# Patient Record
Sex: Male | Born: 1985 | Race: White | Hispanic: No | Smoking: Never smoker
Health system: Southern US, Community
[De-identification: ages and names within clinical notes are randomized; demographics above are authoritative.]

## PROBLEM LIST (undated history)

## (undated) HISTORY — PX: APPENDECTOMY: SHX54

---

## 2018-10-22 ENCOUNTER — Encounter (HOSPITAL_COMMUNITY): Payer: Self-pay | Admitting: Emergency Medicine

## 2018-10-22 ENCOUNTER — Emergency Department (HOSPITAL_COMMUNITY): Payer: No Typology Code available for payment source

## 2018-10-22 ENCOUNTER — Emergency Department (HOSPITAL_COMMUNITY)
Admission: EM | Admit: 2018-10-22 | Discharge: 2018-10-23 | Disposition: A | Discharge status: Home / Self Care | Payer: No Typology Code available for payment source | Attending: Emergency Medicine | Admitting: Emergency Medicine

## 2018-10-22 DIAGNOSIS — Y999 Unspecified external cause status: Secondary | ICD-10-CM | POA: Diagnosis not present

## 2018-10-22 DIAGNOSIS — Y9389 Activity, other specified: Secondary | ICD-10-CM | POA: Insufficient documentation

## 2018-10-22 DIAGNOSIS — S022XXA Fracture of nasal bones, initial encounter for closed fracture: Secondary | ICD-10-CM | POA: Insufficient documentation

## 2018-10-22 DIAGNOSIS — R079 Chest pain, unspecified: Secondary | ICD-10-CM | POA: Diagnosis not present

## 2018-10-22 DIAGNOSIS — R040 Epistaxis: Secondary | ICD-10-CM | POA: Diagnosis not present

## 2018-10-22 DIAGNOSIS — Y929 Unspecified place or not applicable: Secondary | ICD-10-CM | POA: Diagnosis not present

## 2018-10-22 LAB — PROTIME-INR
INR: 1.05
Prothrombin Time: 13.6 seconds (ref 11.4–15.2)

## 2018-10-22 MED ORDER — ACETAMINOPHEN 10 MG/ML IV SOLN
1000.0000 mg | Freq: Four times a day (QID) | INTRAVENOUS | Status: DC
  Administered 2018-10-23: 1000 mg via INTRAVENOUS
  Filled 2018-10-22 (×4): qty 100

## 2018-10-22 MED ORDER — SODIUM CHLORIDE 0.9 % IV BOLUS
1000.0000 mL | Freq: Once | INTRAVENOUS | Status: AC
  Administered 2018-10-22: 1000 mL via INTRAVENOUS

## 2018-10-22 MED ORDER — ONDANSETRON HCL 4 MG/2ML IJ SOLN
4.0000 mg | Freq: Once | INTRAMUSCULAR | Status: AC
  Administered 2018-10-22: 4 mg via INTRAVENOUS
  Filled 2018-10-22: qty 2

## 2018-10-22 MED ORDER — CLINDAMYCIN PHOSPHATE 600 MG/50ML IV SOLN
600.0000 mg | Freq: Once | INTRAVENOUS | Status: AC
  Administered 2018-10-22: 600 mg via INTRAVENOUS
  Filled 2018-10-22: qty 50

## 2018-10-22 MED ORDER — SODIUM CHLORIDE 0.9 % IV BOLUS
1000.0000 mL | Freq: Once | INTRAVENOUS | Status: AC
  Administered 2018-10-23: 1000 mL via INTRAVENOUS

## 2018-10-22 MED ORDER — TETANUS-DIPHTH-ACELL PERTUSSIS 5-2.5-18.5 LF-MCG/0.5 IM SUSP
0.5000 mL | Freq: Once | INTRAMUSCULAR | Status: AC
  Administered 2018-10-22: 0.5 mL via INTRAMUSCULAR
  Filled 2018-10-22: qty 0.5

## 2018-10-22 MED ORDER — SODIUM CHLORIDE 0.9 % IV SOLN: INTRAVENOUS | Status: DC

## 2018-10-22 NOTE — ED Notes (Signed)
Pt had clothes already cut off upon arrival to our ED. Pt had $500 (5- [employer] Hundred Dollar Bills) in pocket that were all partially cut. Pt also had cash in his wallet. All cash counted and put together in wallet. All together there is $1,035 in cash in Pts wallet. Pt and tech- Gus witnessed count. Wallet put in Pt belonging bag with socks.
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
## 2018-10-23 ENCOUNTER — Emergency Department (HOSPITAL_COMMUNITY): Payer: No Typology Code available for payment source

## 2018-10-23 ENCOUNTER — Encounter (HOSPITAL_COMMUNITY): Payer: Self-pay | Admitting: Emergency Medicine

## 2018-10-23 LAB — CBC
HCT: 45.5 % (ref 39.0–52.0)
Hemoglobin: 16.1 g/dL (ref 13.0–17.0)
MCH: 32.9 pg (ref 26.0–34.0)
MCHC: 35.4 g/dL (ref 30.0–36.0)
MCV: 93 fL (ref 80.0–100.0)
NRBC: 0 % (ref 0.0–0.2)
Platelets: 184 10*3/uL (ref 150–400)
RBC: 4.89 MIL/uL (ref 4.22–5.81)
RDW: 11.8 % (ref 11.5–15.5)
WBC: 8.5 10*3/uL (ref 4.0–10.5)

## 2018-10-23 LAB — COMPREHENSIVE METABOLIC PANEL
ALT: 31 U/L (ref 0–44)
AST: 33 U/L (ref 15–41)
Albumin: 4 g/dL (ref 3.5–5.0)
Alkaline Phosphatase: 40 U/L (ref 38–126)
Anion gap: 12 (ref 5–15)
BUN: 16 mg/dL (ref 6–20)
CHLORIDE: 101 mmol/L (ref 98–111)
CO2: 23 mmol/L (ref 22–32)
Calcium: 9.1 mg/dL (ref 8.9–10.3)
Creatinine, Ser: 0.94 mg/dL (ref 0.61–1.24)
GFR calc non Af Amer: >60 mL/min (ref >60)
Glucose, Bld: 98 mg/dL (ref 70–99)
Potassium: 3.9 mmol/L (ref 3.5–5.1)
Sodium: 136 mmol/L (ref 135–145)
Total Bilirubin: 0.9 mg/dL (ref 0.3–1.2)
Total Protein: 6.7 g/dL (ref 6.5–8.1)

## 2018-10-23 LAB — SAMPLE TO BLOOD BANK

## 2018-10-23 LAB — ETHANOL: Alcohol, Ethyl (B): <10 mg/dL (ref <10)

## 2018-10-23 LAB — LACTIC ACID, PLASMA: Lactic Acid, Venous: 0.7 mmol/L (ref 0.5–1.9)

## 2018-10-23 MED ORDER — METHOCARBAMOL 1000 MG/10ML IJ SOLN: 1000.0000 mg | Freq: Once | INTRAMUSCULAR | Status: DC

## 2018-10-23 MED ORDER — DICLOFENAC SODIUM ER 100 MG PO TB24: 100.0000 mg | ORAL_TABLET | Freq: Every day | ORAL | 0 refills | Status: DC

## 2018-10-23 MED ORDER — DICLOFENAC SODIUM ER 100 MG PO TB24: 100.0000 mg | ORAL_TABLET | Freq: Every day | ORAL | 0 refills | Status: AC

## 2018-10-23 MED ORDER — IOHEXOL 300 MG/ML  SOLN
100.0000 mL | Freq: Once | INTRAMUSCULAR | Status: AC | PRN
  Administered 2018-10-23: 100 mL via INTRAVENOUS

## 2018-10-23 MED ORDER — METHOCARBAMOL 500 MG PO TABS: 500.0000 mg | ORAL_TABLET | Freq: Two times a day (BID) | ORAL | 0 refills | Status: AC

## 2018-10-23 MED ORDER — KETOROLAC TROMETHAMINE 30 MG/ML IJ SOLN
30.0000 mg | Freq: Once | INTRAMUSCULAR | Status: AC
  Administered 2018-10-23: 30 mg via INTRAVENOUS
  Filled 2018-10-23: qty 1

## 2018-10-23 MED ORDER — CLINDAMYCIN HCL 300 MG PO CAPS: 300.0000 mg | ORAL_CAPSULE | Freq: Four times a day (QID) | ORAL | 0 refills | Status: AC

## 2018-10-23 MED ORDER — CLINDAMYCIN HCL 300 MG PO CAPS: 300.0000 mg | ORAL_CAPSULE | Freq: Four times a day (QID) | ORAL | 0 refills | Status: DC

## 2018-10-23 MED ORDER — OXYMETAZOLINE HCL 0.05 % NA SOLN
1.0000 | Freq: Once | NASAL | Status: AC
  Administered 2018-10-23: 1 via NASAL
  Filled 2018-10-23: qty 15

## 2018-10-23 MED ORDER — METHOCARBAMOL 1000 MG/10ML IJ SOLN
1000.0000 mg | Freq: Once | INTRAVENOUS | Status: AC
  Administered 2018-10-23: 1000 mg via INTRAVENOUS
  Filled 2018-10-23: qty 10
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
## 2018-10-23 NOTE — ED Notes (Signed)
PT states understanding of care given, follow up care, and medication prescribed. PT ambulated from ED to car with a steady gait. 
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
## 2023-02-11 ENCOUNTER — Inpatient Hospital Stay
Admit: 2023-02-11 | Discharge: 2023-02-12 | Disposition: A | Discharge status: Home / Self Care | Payer: PRIVATE HEALTH INSURANCE | Attending: Emergency Medicine

## 2023-02-11 DIAGNOSIS — K529 Noninfective gastroenteritis and colitis, unspecified: Secondary | ICD-10-CM

## 2023-02-11 LAB — URINE CULTURE HOLD SAMPLE

## 2023-02-11 MED ORDER — SODIUM CHLORIDE 0.9 % IV BOLUS
0.9 | Freq: Once | INTRAVENOUS | Status: AC
Start: 2023-02-11 — End: 2023-02-11
  Administered 2023-02-12: 1000 mL via INTRAVENOUS

## 2023-02-11 MED FILL — SODIUM CHLORIDE 0.9 % IV SOLN: 0.9 % | INTRAVENOUS | Qty: 1000

## 2023-02-11 NOTE — ED Notes (Signed)
Went over discharge paperwork with pt. Pt verbalized understanding. Pt ambulated out of ED without incident

## 2023-02-11 NOTE — ED Triage Notes (Signed)
Pt comes to ED from home with reports of having diarrhea x2 days. States prior he used ice from a cooler for his drink and then the diarrhea began. Denies nausea and abdominal pain.     Kayla, PA assessing pt in triage.
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
## 2023-02-12 LAB — URINALYSIS WITH MICROSCOPIC
BACTERIA, URINE: NEGATIVE /hpf
Blood, Urine: NEGATIVE
Glucose, Ur: NEGATIVE mg/dL
Leukocyte Esterase, Urine: NEGATIVE
Nitrite, Urine: NEGATIVE
Protein, UA: 30 mg/dL — AB
Urobilinogen, Urine: 1 EU/dL (ref 0.2–1.0)
pH, Urine: 5.5 (ref 5.0–8.0)

## 2023-02-12 LAB — CBC WITH AUTO DIFFERENTIAL
Basophils %: 0 % (ref 0–1)
Basophils Absolute: 0 10*3/uL (ref 0.0–0.1)
Eosinophils %: 1 % (ref 0–7)
Eosinophils Absolute: 0.1 10*3/uL (ref 0.0–0.4)
Hematocrit: 42.8 % (ref 36.6–50.3)
Hemoglobin: 15.7 g/dL (ref 12.1–17.0)
Immature Granulocytes %: 0 % (ref 0.0–0.5)
Immature Granulocytes Absolute: 0 10*3/uL (ref 0.00–0.04)
Lymphocytes %: 7 % — ABNORMAL LOW (ref 12–49)
Lymphocytes Absolute: 0.8 10*3/uL (ref 0.8–3.5)
MCH: 33.6 PG (ref 26.0–34.0)
MCHC: 36.7 g/dL — ABNORMAL HIGH (ref 30.0–36.5)
MCV: 91.6 FL (ref 80.0–99.0)
MPV: 11.1 FL (ref 8.9–12.9)
Monocytes %: 10 % (ref 5–13)
Monocytes Absolute: 1.2 10*3/uL — ABNORMAL HIGH (ref 0.0–1.0)
Neutrophils %: 82 % — ABNORMAL HIGH (ref 32–75)
Neutrophils Absolute: 9.4 10*3/uL — ABNORMAL HIGH (ref 1.8–8.0)
Nucleated RBCs: 0 PER 100 WBC
Platelets: 141 10*3/uL — ABNORMAL LOW (ref 150–400)
RBC: 4.67 M/uL (ref 4.10–5.70)
RDW: 11.9 % (ref 11.5–14.5)
WBC: 11.6 10*3/uL — ABNORMAL HIGH (ref 4.1–11.1)
nRBC: 0 10*3/uL (ref 0.00–0.01)

## 2023-02-12 LAB — COMPREHENSIVE METABOLIC PANEL
ALT: 28 U/L (ref 12–78)
AST: 16 U/L (ref 15–37)
Albumin/Globulin Ratio: 0.9 — ABNORMAL LOW (ref 1.1–2.2)
Albumin: 3.6 g/dL (ref 3.5–5.0)
Alk Phosphatase: 74 U/L (ref 45–117)
Anion Gap: 3 mmol/L — ABNORMAL LOW (ref 5–15)
BUN/Creatinine Ratio: 16 (ref 12–20)
BUN: 16 MG/DL (ref 6–20)
CO2: 29 mmol/L (ref 21–32)
Calcium: 9.5 MG/DL (ref 8.5–10.1)
Chloride: 105 mmol/L (ref 97–108)
Creatinine: 1.01 MG/DL (ref 0.70–1.30)
Est, Glom Filt Rate: >90 mL/min/{1.73_m2} (ref >60)
Globulin: 4.2 g/dL — ABNORMAL HIGH (ref 2.0–4.0)
Glucose: 93 mg/dL (ref 65–100)
Potassium: 3.9 mmol/L (ref 3.5–5.1)
Sodium: 137 mmol/L (ref 136–145)
Total Bilirubin: 0.9 MG/DL (ref 0.2–1.0)
Total Protein: 7.8 g/dL (ref 6.4–8.2)

## 2023-02-12 LAB — BILIRUBIN, CONFIRMATORY: Bilirubin Confirmation, UA: NEGATIVE

## 2023-02-12 LAB — LIPASE: Lipase: 17 U/L (ref 13–75)
